# Patient Record
Sex: Male | Born: 1940 | Race: White | Hispanic: No | Marital: Single
Health system: Southern US, Community
[De-identification: ages and names within clinical notes are randomized; demographics above are authoritative.]

---

## 1998-05-10 ENCOUNTER — Ambulatory Visit (HOSPITAL_COMMUNITY): Admission: RE | Admit: 1998-05-10 | Discharge: 1998-05-10 | Payer: Self-pay | Admitting: Specialist

## 1998-06-04 ENCOUNTER — Inpatient Hospital Stay (HOSPITAL_COMMUNITY): Admission: RE | Admit: 1998-06-04 | Discharge: 1998-06-07 | Payer: Self-pay | Admitting: Specialist

## 1999-09-06 ENCOUNTER — Encounter: Payer: Self-pay | Admitting: Urology

## 1999-09-06 ENCOUNTER — Encounter (INDEPENDENT_AMBULATORY_CARE_PROVIDER_SITE_OTHER): Payer: Self-pay | Admitting: Specialist

## 1999-09-06 ENCOUNTER — Ambulatory Visit (HOSPITAL_COMMUNITY): Admission: RE | Admit: 1999-09-06 | Discharge: 1999-09-06 | Payer: Self-pay | Admitting: Urology

## 1999-09-07 ENCOUNTER — Emergency Department (HOSPITAL_COMMUNITY): Admission: EM | Admit: 1999-09-07 | Discharge: 1999-09-07 | Payer: Self-pay | Admitting: Emergency Medicine

## 1999-09-08 ENCOUNTER — Emergency Department (HOSPITAL_COMMUNITY): Admission: EM | Admit: 1999-09-08 | Discharge: 1999-09-08 | Payer: Self-pay | Admitting: Emergency Medicine

## 2000-10-21 ENCOUNTER — Ambulatory Visit (HOSPITAL_COMMUNITY): Admission: RE | Admit: 2000-10-21 | Discharge: 2000-10-21 | Payer: Self-pay | Admitting: Specialist

## 2000-10-21 ENCOUNTER — Encounter: Payer: Self-pay | Admitting: Specialist

## 2000-12-21 ENCOUNTER — Encounter: Payer: Self-pay | Admitting: Specialist

## 2000-12-28 ENCOUNTER — Inpatient Hospital Stay (HOSPITAL_COMMUNITY): Admission: RE | Admit: 2000-12-28 | Discharge: 2000-12-30 | Payer: Self-pay | Admitting: Specialist

## 2000-12-28 ENCOUNTER — Encounter: Payer: Self-pay | Admitting: Specialist

## 2002-07-01 ENCOUNTER — Encounter: Admission: RE | Admit: 2002-07-01 | Discharge: 2002-07-01 | Payer: Self-pay | Admitting: Specialist

## 2002-07-01 ENCOUNTER — Encounter: Payer: Self-pay | Admitting: Specialist

## 2003-12-04 ENCOUNTER — Ambulatory Visit (HOSPITAL_COMMUNITY): Admission: RE | Admit: 2003-12-04 | Discharge: 2003-12-04 | Payer: Self-pay | Admitting: Orthopedic Surgery

## 2006-08-27 ENCOUNTER — Ambulatory Visit (HOSPITAL_COMMUNITY): Admission: RE | Admit: 2006-08-27 | Discharge: 2006-08-27 | Payer: Self-pay | Admitting: Orthopedic Surgery

## 2009-04-18 ENCOUNTER — Ambulatory Visit: Payer: Self-pay | Admitting: Family Medicine

## 2009-04-18 DIAGNOSIS — E785 Hyperlipidemia, unspecified: Secondary | ICD-10-CM

## 2009-04-18 DIAGNOSIS — S61209A Unspecified open wound of unspecified finger without damage to nail, initial encounter: Secondary | ICD-10-CM | POA: Insufficient documentation

## 2009-04-18 DIAGNOSIS — I10 Essential (primary) hypertension: Secondary | ICD-10-CM | POA: Insufficient documentation

## 2009-04-20 ENCOUNTER — Encounter: Payer: Self-pay | Admitting: Family Medicine

## 2011-03-07 NOTE — Consult Note (Signed)
[facility]  Patient:    Spencer Lewis, Spencer Lewis                    MRN: [id number] Proc. Date: 12/30/00 Adm. Date:  [id number] Disc. Date: [id number] Attending:  Lubertha South CC:         Kerrin Champagne, M.D.   Consultation Report  I was asked to see this gentleman for urinary retention postoperative back surgery.  This gentleman has had a urologic history and has been followed by Dr. Vernie Ammons for quite some time.  He has a history of transitional cell carcinoma of the bladder.  He has a history of a neurogenic bladder and he has had voiding troubles after previous surgical operations.  He has been voiding well lately.  He also has a history of an asymptomatic renal calculus and an asymptomatic left renal cyst.  He has had some erectile dysfunction.  He could not void after this recent surgery.  A Foley catheter has been placed.  His urinalysis on December 29, 2000 showed 2150 white cells per high powered field and TNTC red cells.  There was just a few bacteria.  He has been started on Cipro and a Foley catheter has been put in place.  I do not believe a urine culture is pending at this time.  He also was put on some Flomax.  CURRENT MEDICATIONS:  Monopril, Zocor, Elavil, folic acid.  PHYSICAL EXAMINATION  ABDOMEN:  Soft and nontender.  GENITOURINARY:  External genitalia:  Unremarkable with a Foley catheter in place.  PLAN:  I believe he can go home tonight with a Foley catheter in place.  I will keep him on Flomax 0.4 mg for at least the next 10 days.  I also will continue his Cipro 0.5 g b.i.d.  He will remove the Foley catheter at home on Friday morning, January 01, 2001.  He will let Dr. Vernie Ammons know if he has any further voiding dysfunction.  He will otherwise keep his scheduled cystoscopic examination for April in the office per Dr. Vernie Ammons. DD:  12/30/00 TD:  12/30/00 Job: [id number]

## 2011-03-07 NOTE — H&P (Signed)
[facility]  Patient:    Spencer Lewis, Spencer Lewis                          MRN: [id number] Adm. Date:  12/28/00 Attending:  Kerrin Champagne, M.D. Dictator:   Alexzandrew L. Julien Girt, P.A.-C. CC:         Janene Harvey, M.D.   History and Physical  DATE OF BIRTH:  September 24, 1941.  DATE OF OFFICE VISIT HISTORY AND PHYSICAL:  December 21, 2000.  CHIEF COMPLAINT:  Upper and mid-back severe pain.  HISTORY OF PRESENT ILLNESS:  Patient is a 70 year old male, well-known to Dr. Kerrin Champagne and [facility], having long-standing history of multiple-level degenerative disk disease and also spinal stenosis. He has undergone multiple back surgeries, ranging from 1975 all the way up to his most recent surgery back in 1999.  He has had continued problems and increasing severe back pain for which he has been evaluated by Dr. Otelia Sergeant in the office.  He has undergone outpatient diagnostic studies including myelogram and post-myelogram CTs.  Myelogram workup did show moderate-to-severe central stenosis at the level of T12-L1; this is the level above his prior surgeries.  He has been treated conservatively for this pain, however, has been refractory to conservative treatment.  The pain is quite severe now.  Due to the significant findings, it is felt he could benefit from undergoing surgical intervention.  It is decided that he will undergo decompression with laminectomy at that level.  It is also noted that if this does not improve his condition, that we may have to consider multilevel fusions in the future; however, at this time, it is felt that he could benefit from undergoing decompression laminectomy.  Risks and benefits were discussed.  He has elected to proceed with surgery.  ALLERGIES:  No known drug allergies.  CURRENT MEDICATIONS 1. Monopril 40 mg daily. 2. Zocor 40 mg daily. 3. Elavil 50 mg at night. 4. Folic acid.  PAST MEDICAL HISTORY:   Hypertension, hypercholesterolemia, past history of transfusion back in 1982 with a prior back surgery, also history of bladder cancer tumor.  PAST SURGICAL HISTORY:  Excision of bladder tumor, November of 2000.  Multiple back procedures ranging from 1975 to most recently in 1999.  SOCIAL HISTORY:  Patient is married.  Two children.  Denies use of tobacco products.  Only seldom intake of alcohol.  He is retired from [employer].  FAMILY HISTORY:  Father deceased, age 50, with history of prostate disease and heart failure.  Mother deceased, age 84, with a history of congestive heart failure and Alzheimers.  REVIEW OF SYSTEMS:  GENERAL:  No fevers, chills or night sweats.  NEUROLOGIC: Patient has had some numbness and slight weakness down into the right lower extremity.  No seizures, syncope or paralysis.  RESPIRATORY:  No shortness of breath, productive cough or hemoptysis.  CARDIOVASCULAR:  Patient does have a history of hypertension.  Denies any chest pain, angina or orthopnea.  GI:  No nausea, vomiting, diarrhea or constipation.  GU:  No dysuria, hematuria or discharge.  MUSCULOSKELETAL:  Pertinent to that of the back found in the history of present illness.  PHYSICAL EXAMINATION  VITAL SIGNS:  Pulse 88, respirations 16, blood pressure 152/86.  GENERAL:  Patient is a 70 year old white male, short in stature, well-nourished, well-developed, and appears to be in no acute distress.  He is alert, oriented and cooperative.  HEENT:  Normocephalic,  atraumatic.  Pupils are round and reactive.  Oropharynx is clear.  NECK:  Supple.  No carotid bruits are appreciated.  CHEST:  Clear to auscultation and percussion.  No rhonchi or rales on exam.  HEART:  Regular rate and rhythm.  No murmur is appreciated.  ABDOMEN:  Soft abdomen, slightly round.  Bowel sounds are present.  No rebound or guarding.  Nontender to palpation.  RECTAL:  Not done, not pertinent to present  illness.  BREASTS:  Not done, not pertinent to present illness.  GENITALIA:  Not done, not pertinent to present illness.  EXTREMITIES:  Significant to that to the lower extremities.  On exam, patient does have some clonus noted in the left lower extremity with three to four beats; only two beats of clonus to the right lower extremity.  Negative straight leg raises.  Patient is able to walk heel and toe and maintain position.  Deep tendon reflexes are +1 at the knees and symmetrical, +0 at the ankles and symmetrical.  IMPRESSION 1. Severe spinal stenosis, T12-L1, with degenerative disk disease, T12-L1. 2. Hypertension. 3. Hypercholesterolemia. 4. History of transfusion in 1982 with previous back surgery. 5. Status post multiple back procedures from 1975 to 1999.  PLAN:  Patient will be admitted to [facility] to undergo a decompressive laminectomy at the level of T12 and L1.  Surgery is to be performed by Dr. Vira Browns.  Patients medical physician is Dr. Janene Harvey; Dr. Landry Mellow will be notified of the room number on admission and will be consulted if needed for any medical assistance or medical information concerning this patient. DD:  12/22/00 TD:  12/22/00 Job: [id number]

## 2011-03-07 NOTE — Op Note (Signed)
[facility]  Patient:    Spencer Lewis, Spencer Lewis                    MRN: [id number] Proc. Date: 12/28/00 Adm. Date:  [id number] Attending:  Lubertha South                           Operative Report  PREOPERATIVE DIAGNOSIS:  Central spinal canal stenosis at the junctional segment at T12-L1 above a long segment fusion L1 to sacrum.  POSTOPERATIVE DIAGNOSIS:  Central spinal canal stenosis at the junctional segment at T12-L1 above a long segement fusion L1 to sacrum.  OPERATION:  Central laminectomy and decompression at the T12-L1 SURGEON:  Kerrin Champagne, M.D.  ASSISTANT:  Javier Docker, M.D.  ANESTHESIOLOGIST:  Lestine Box, M.D.  ANESTHESIA:  GOT  ESTIMATED BLOOD LOSS:  100 cc.  DRAINS:  None.  BRIEF CLINICAL HISTORY:  The patient is a 70 year old male who has undergone previous diskectomies and fusions at L3 to sacrum and eventually extended to the L2-3 level because of spondylolisthesis and degenerative changes, then to the L1-2 level.  The patient has since developed progressive pain into his lower back and discomfort into his buttocks with ambulation and upright position.  Followup studies demonstrate complete myelogram block at the T12-L1 level above the long segment fusion consistent with spinal stenosis.  There is deformity in the sac posteriorly related to spondylosis changes of facets primarily.  He is brought to the operating room to undergo a central decompression after conservative treatment including epidural steroid and therapy has been unsuccessful in relieving pain.  INTRAOPERATIVE FINDINGS:  Severe spinal stenosis at the T12-L1 level, mild at T11-12.  There were significant hypertrophic changes involving ligamentum flavum extending down and also causing L1 compression bilaterally right side greater than left.  DESCRIPTION OF PROCEDURE:  After adequate general anesthesia, the patient in the knee chest position, Andrews  frame, standard preoperative antibiotics, standard prep with DuraPrep solution, draped in the usual manner, iodine-impregnated Vi-Drape.  The patient was given preoperative antibiotics.  Incision at the upper end of the previous incision scar, extending superiorly an additional 3 to 4 cm through the skin and subcutaneous layers using a #10 blade scalpel after infiltration with Marcaine 0.5% with 1:200,000 epinephrine.  Incision ellipsed the superior aspect of the old incision scar. It was carried down to the lumbar dorsal fascia overlying the spinous processes that were present at the lower portion of the patients dorsal spine including T11 and T12.  Clamps were first placed on the T10-T11 spinous processes, and then a clamp was placed on the T12 spinous process.  Incision carried over the spinous process of T11 and T12 bilaterally.  Two Cobbs used to elevate the paradorsal muscles off the posterior aspect of the lamina of T12 and T11.  McCullough retractor was inserted.  Leksell rongeur was then used to excise the spinous process of T12, then the posterior aspect of lamina of T12 such that a 3 mm Kerrison could be introduced in the midline beneath the lamina, and this was used to resect and perform initial central laminectomy within the very center portion of the canal.  This was carried up to the T11-12 level.  The central laminectomy was then widened using 2 and 3 mm Kerrisons.  High-speed bur was used to carefully thin the posterior aspect of the lamina as it was quite hypertrophic including thinning of the inferior articular process  of T12 bilaterally, resecting approximately 30% on either side.  With this, then, the laminectomy was carried centrally up to the T11-12 level, then the hypertrophic ligamentum flavum present was resected along with medial aspect of the facets over about 10% on either side, decompressing the T11-12 level, then continuing downwards and decompressing the  lateral recess at the T11-12 level and then T12-L1 level on the right side, resecting the medial aspect of the facet over approximately 20% of its volume and removing scar tissue and adjacent ligamentum flavum that was adherent to the thecal sac overlying the L1 nerve root as it exited beneath the L1 pedicle.  A constricting band of scar tissue and residual ligamentum flavum was resected centrally that was compressing the canal and causing severe spinal stenosis. After this resection, the spinal canal appeared to be quite nicely open.  The lateral recess on the left side was then decompressed, excising what was a very large spur off the medial aspect of the left side at T12-L1 by resecting lateral to the largest portion of the spur, decompressing the spinal canal by lifting away the osteophyte and hypertrophic ligamentum flavum at one time. It was peeled off the thecal sac quite nicely without causing undue pressure.  With this, then, the left L1 nerve root was similarly decompressed by removing hypertrophic ligamentum flavum and scar tissue that was adjacent to the T12-L1 level.  With this, then, the spinal canal was nicely decompressed and showed normal pulsation.  Irrigation was then performed.  Bone wax applied to the bleeding cancellous bone surfaces, excess bone wax removed.  Gelfoam was then placed over the central laminotomy defect, and the McCullough retractors removed.  Bleeders controlled using electrocautery.  The incision was then closed by approximating the paradorsal muscles in midline with loose #1 Vicryl sutures. The lumbodorsal fascia approximated in midline with interrupted #1 Vicryl sutures, deep subcutaneous layers approximated with interrupted #0 Vicryl sutures, more superficial layers with interrupted 2-0 Vicryl suture, and the skin closed with running subcuticular stitch of 4-0 Monocryl, and this was buried.  Tincture of Benzoin and Steri-Strips then applied.  The  patient was then returned to a supine position, reactivated, extubated, and returned to the recovery room in satisfactory condition.  All instrument and sponge counts were correct. DD:  12/28/00  TD:  12/29/00 Job: [id number]

## 2017-01-26 ENCOUNTER — Other Ambulatory Visit (HOSPITAL_COMMUNITY): Payer: Medicare HMO

## 2017-01-26 ENCOUNTER — Inpatient Hospital Stay
Admission: RE | Admit: 2017-01-26 | Discharge: 2017-02-26 | Disposition: A | Discharge status: Hospice | Payer: Medicare HMO | Attending: Internal Medicine | Admitting: Internal Medicine

## 2017-01-26 DIAGNOSIS — J984 Other disorders of lung: Secondary | ICD-10-CM

## 2017-01-26 DIAGNOSIS — A419 Sepsis, unspecified organism: Secondary | ICD-10-CM

## 2017-01-26 DIAGNOSIS — T17908A Unspecified foreign body in respiratory tract, part unspecified causing other injury, initial encounter: Secondary | ICD-10-CM

## 2017-01-26 DIAGNOSIS — J90 Pleural effusion, not elsewhere classified: Secondary | ICD-10-CM

## 2017-01-26 DIAGNOSIS — Z0189 Encounter for other specified special examinations: Secondary | ICD-10-CM

## 2017-01-26 DIAGNOSIS — R509 Fever, unspecified: Secondary | ICD-10-CM

## 2017-01-26 DIAGNOSIS — K567 Ileus, unspecified: Secondary | ICD-10-CM

## 2017-01-26 DIAGNOSIS — R1110 Vomiting, unspecified: Secondary | ICD-10-CM

## 2017-01-26 DIAGNOSIS — J869 Pyothorax without fistula: Secondary | ICD-10-CM

## 2017-01-26 DIAGNOSIS — J9690 Respiratory failure, unspecified, unspecified whether with hypoxia or hypercapnia: Secondary | ICD-10-CM

## 2017-01-26 DIAGNOSIS — Z452 Encounter for adjustment and management of vascular access device: Secondary | ICD-10-CM

## 2017-01-27 LAB — BLOOD GAS, ARTERIAL
Acid-Base Excess: 2.7 mmol/L — ABNORMAL HIGH (ref 0.0–2.0)
Bicarbonate: 27.2 mmol/L (ref 20.0–28.0)
FIO2: 40
MECHVT: 380 mL
O2 Saturation: 98.6 %
PEEP: 5 cmH2O
Patient temperature: 97.6
RATE: 14 resp/min
pCO2 arterial: 44.3 mmHg (ref 32.0–48.0)
pH, Arterial: 7.402 (ref 7.350–7.450)
pO2, Arterial: 116 mmHg — ABNORMAL HIGH (ref 83.0–108.0)

## 2017-01-27 LAB — CBC
HCT: 27.2 % — ABNORMAL LOW (ref 39.0–52.0)
Hemoglobin: 8.3 g/dL — ABNORMAL LOW (ref 13.0–17.0)
MCH: 28.2 pg (ref 26.0–34.0)
MCHC: 30.5 g/dL (ref 30.0–36.0)
MCV: 92.5 fL (ref 78.0–100.0)
Platelets: 574 10*3/uL — ABNORMAL HIGH (ref 150–400)
RBC: 2.94 MIL/uL — ABNORMAL LOW (ref 4.22–5.81)
RDW: 17.1 % — ABNORMAL HIGH (ref 11.5–15.5)
WBC: 13.6 10*3/uL — ABNORMAL HIGH (ref 4.0–10.5)

## 2017-01-27 LAB — COMPREHENSIVE METABOLIC PANEL
ALT: 13 U/L — ABNORMAL LOW (ref 17–63)
AST: 18 U/L (ref 15–41)
Albumin: 1.6 g/dL — ABNORMAL LOW (ref 3.5–5.0)
Alkaline Phosphatase: 79 U/L (ref 38–126)
Anion gap: 9 (ref 5–15)
BUN: 38 mg/dL — ABNORMAL HIGH (ref 6–20)
CO2: 24 mmol/L (ref 22–32)
Calcium: 8.1 mg/dL — ABNORMAL LOW (ref 8.9–10.3)
Chloride: 110 mmol/L (ref 101–111)
Creatinine, Ser: 0.63 mg/dL (ref 0.61–1.24)
GFR calc Af Amer: >60 mL/min (ref >60)
GFR calc non Af Amer: >60 mL/min (ref >60)
Glucose, Bld: 162 mg/dL — ABNORMAL HIGH (ref 65–99)
Potassium: 5.1 mmol/L (ref 3.5–5.1)
Sodium: 143 mmol/L (ref 135–145)
Total Bilirubin: 0.2 mg/dL — ABNORMAL LOW (ref 0.3–1.2)
Total Protein: 6.3 g/dL — ABNORMAL LOW (ref 6.5–8.1)

## 2017-01-27 LAB — C DIFFICILE QUICK SCREEN W PCR REFLEX
C Diff antigen: NEGATIVE
C Diff interpretation: NOT DETECTED
C Diff toxin: NEGATIVE

## 2017-01-27 LAB — MAGNESIUM: Magnesium: 2.1 mg/dL (ref 1.7–2.4)

## 2017-01-27 LAB — APTT: aPTT: 41 seconds — ABNORMAL HIGH (ref 24–36)

## 2017-01-28 ENCOUNTER — Other Ambulatory Visit (HOSPITAL_COMMUNITY): Payer: Medicare HMO

## 2017-01-28 LAB — CBC
HCT: 27.7 % — ABNORMAL LOW (ref 39.0–52.0)
HEMATOCRIT: 28.4 % — AB (ref 39.0–52.0)
HEMOGLOBIN: 8.6 g/dL — AB (ref 13.0–17.0)
Hemoglobin: 8.1 g/dL — ABNORMAL LOW (ref 13.0–17.0)
MCH: 28.1 pg (ref 26.0–34.0)
MCH: 27.5 pg (ref 26.0–34.0)
MCHC: 30.3 g/dL (ref 30.0–36.0)
MCHC: 29.2 g/dL — AB (ref 30.0–36.0)
MCV: 92.8 fL (ref 78.0–100.0)
MCV: 93.9 fL (ref 78.0–100.0)
PLATELETS: 542 10*3/uL — AB (ref 150–400)
Platelets: 526 10*3/uL — ABNORMAL HIGH (ref 150–400)
RBC: 3.06 MIL/uL — ABNORMAL LOW (ref 4.22–5.81)
RBC: 2.95 MIL/uL — ABNORMAL LOW (ref 4.22–5.81)
RDW: 17.3 % — ABNORMAL HIGH (ref 11.5–15.5)
RDW: 17.4 % — AB (ref 11.5–15.5)
WBC: 15.5 10*3/uL — AB (ref 4.0–10.5)
WBC: 19.4 10*3/uL — ABNORMAL HIGH (ref 4.0–10.5)

## 2017-01-28 LAB — URINALYSIS, ROUTINE W REFLEX MICROSCOPIC
Bilirubin Urine: NEGATIVE
GLUCOSE, UA: NEGATIVE mg/dL
Ketones, ur: NEGATIVE mg/dL
Nitrite: NEGATIVE
PH: 6 (ref 5.0–8.0)
Protein, ur: 100 mg/dL — AB
SPECIFIC GRAVITY, URINE: 1.016 (ref 1.005–1.030)
Squamous Epithelial / LPF: NONE SEEN

## 2017-01-28 LAB — MAGNESIUM: MAGNESIUM: 2.4 mg/dL (ref 1.7–2.4)

## 2017-01-28 LAB — BASIC METABOLIC PANEL
ANION GAP: 12 (ref 5–15)
Anion gap: 11 (ref 5–15)
BUN: 54 mg/dL — AB (ref 6–20)
BUN: 61 mg/dL — AB (ref 6–20)
CALCIUM: 8.4 mg/dL — AB (ref 8.9–10.3)
CALCIUM: 8.5 mg/dL — AB (ref 8.9–10.3)
CHLORIDE: 105 mmol/L (ref 101–111)
CO2: 26 mmol/L (ref 22–32)
CO2: 25 mmol/L (ref 22–32)
CREATININE: 0.96 mg/dL (ref 0.61–1.24)
Chloride: 110 mmol/L (ref 101–111)
Creatinine, Ser: 1.42 mg/dL — ABNORMAL HIGH (ref 0.61–1.24)
GFR calc Af Amer: 55 mL/min — ABNORMAL LOW (ref >60)
GFR, EST NON AFRICAN AMERICAN: 47 mL/min — AB (ref >60)
GLUCOSE: 127 mg/dL — AB (ref 65–99)
Glucose, Bld: 122 mg/dL — ABNORMAL HIGH (ref 65–99)
POTASSIUM: 6 mmol/L — AB (ref 3.5–5.1)
Potassium: 5.5 mmol/L — ABNORMAL HIGH (ref 3.5–5.1)
Sodium: 143 mmol/L (ref 135–145)
Sodium: 146 mmol/L — ABNORMAL HIGH (ref 135–145)

## 2017-01-28 LAB — LACTIC ACID, PLASMA: LACTIC ACID, VENOUS: 2.5 mmol/L — AB (ref 0.5–1.9)

## 2017-01-28 LAB — POTASSIUM: POTASSIUM: 5.7 mmol/L — AB (ref 3.5–5.1)

## 2017-01-29 LAB — CBC
HEMATOCRIT: 29.6 % — AB (ref 39.0–52.0)
Hemoglobin: 8.9 g/dL — ABNORMAL LOW (ref 13.0–17.0)
MCH: 28.2 pg (ref 26.0–34.0)
MCHC: 30.1 g/dL (ref 30.0–36.0)
MCV: 93.7 fL (ref 78.0–100.0)
PLATELETS: 500 10*3/uL — AB (ref 150–400)
RBC: 3.16 MIL/uL — AB (ref 4.22–5.81)
RDW: 18 % — AB (ref 11.5–15.5)
WBC: 20.2 10*3/uL — AB (ref 4.0–10.5)

## 2017-01-29 LAB — BASIC METABOLIC PANEL
Anion gap: 13 (ref 5–15)
BUN: 70 mg/dL — AB (ref 6–20)
CO2: 23 mmol/L (ref 22–32)
CREATININE: 1.63 mg/dL — AB (ref 0.61–1.24)
Calcium: 7.8 mg/dL — ABNORMAL LOW (ref 8.9–10.3)
Chloride: 116 mmol/L — ABNORMAL HIGH (ref 101–111)
GFR calc Af Amer: 47 mL/min — ABNORMAL LOW (ref >60)
GFR, EST NON AFRICAN AMERICAN: 40 mL/min — AB (ref >60)
GLUCOSE: 111 mg/dL — AB (ref 65–99)
POTASSIUM: 3.9 mmol/L (ref 3.5–5.1)
Sodium: 152 mmol/L — ABNORMAL HIGH (ref 135–145)

## 2017-01-29 LAB — PHOSPHORUS: Phosphorus: 6.3 mg/dL — ABNORMAL HIGH (ref 2.5–4.6)

## 2017-01-29 LAB — MAGNESIUM: MAGNESIUM: 2.4 mg/dL (ref 1.7–2.4)

## 2017-01-29 LAB — PROCALCITONIN: Procalcitonin: 1.42 ng/mL

## 2017-01-30 ENCOUNTER — Other Ambulatory Visit (HOSPITAL_COMMUNITY): Payer: Medicare HMO

## 2017-01-30 LAB — CBC
HEMATOCRIT: 26.4 % — AB (ref 39.0–52.0)
HEMOGLOBIN: 7.8 g/dL — AB (ref 13.0–17.0)
MCH: 27.7 pg (ref 26.0–34.0)
MCHC: 29.5 g/dL — ABNORMAL LOW (ref 30.0–36.0)
MCV: 93.6 fL (ref 78.0–100.0)
Platelets: 485 10*3/uL — ABNORMAL HIGH (ref 150–400)
RBC: 2.82 MIL/uL — AB (ref 4.22–5.81)
RDW: 17.4 % — ABNORMAL HIGH (ref 11.5–15.5)
WBC: 18.4 10*3/uL — ABNORMAL HIGH (ref 4.0–10.5)

## 2017-01-30 LAB — URINE CULTURE: Culture: >=100000 — AB

## 2017-01-30 LAB — BASIC METABOLIC PANEL
ANION GAP: 12 (ref 5–15)
ANION GAP: 9 (ref 5–15)
BUN: 69 mg/dL — AB (ref 6–20)
BUN: 64 mg/dL — AB (ref 6–20)
CHLORIDE: 117 mmol/L — AB (ref 101–111)
CO2: 23 mmol/L (ref 22–32)
CO2: 24 mmol/L (ref 22–32)
CREATININE: 1.51 mg/dL — AB (ref 0.61–1.24)
Calcium: 7.6 mg/dL — ABNORMAL LOW (ref 8.9–10.3)
Calcium: 7.7 mg/dL — ABNORMAL LOW (ref 8.9–10.3)
Chloride: 117 mmol/L — ABNORMAL HIGH (ref 101–111)
Creatinine, Ser: 1.49 mg/dL — ABNORMAL HIGH (ref 0.61–1.24)
GFR calc Af Amer: 51 mL/min — ABNORMAL LOW (ref >60)
GFR calc non Af Amer: 44 mL/min — ABNORMAL LOW (ref >60)
GFR, EST AFRICAN AMERICAN: 52 mL/min — AB (ref >60)
GFR, EST NON AFRICAN AMERICAN: 45 mL/min — AB (ref >60)
Glucose, Bld: 128 mg/dL — ABNORMAL HIGH (ref 65–99)
Glucose, Bld: 119 mg/dL — ABNORMAL HIGH (ref 65–99)
POTASSIUM: 2.8 mmol/L — AB (ref 3.5–5.1)
POTASSIUM: 3.6 mmol/L (ref 3.5–5.1)
SODIUM: 150 mmol/L — AB (ref 135–145)
Sodium: 152 mmol/L — ABNORMAL HIGH (ref 135–145)

## 2017-01-30 LAB — CULTURE, RESPIRATORY W GRAM STAIN

## 2017-01-30 LAB — MAGNESIUM: Magnesium: 2.1 mg/dL (ref 1.7–2.4)

## 2017-01-30 LAB — PHOSPHORUS: PHOSPHORUS: 6.1 mg/dL — AB (ref 2.5–4.6)

## 2017-01-30 LAB — CULTURE, RESPIRATORY

## 2017-01-31 LAB — CBC
HCT: 24.8 % — ABNORMAL LOW (ref 39.0–52.0)
Hemoglobin: 7.4 g/dL — ABNORMAL LOW (ref 13.0–17.0)
MCH: 27.7 pg (ref 26.0–34.0)
MCHC: 29.8 g/dL — AB (ref 30.0–36.0)
MCV: 92.9 fL (ref 78.0–100.0)
Platelets: 418 10*3/uL — ABNORMAL HIGH (ref 150–400)
RBC: 2.67 MIL/uL — AB (ref 4.22–5.81)
RDW: 17.5 % — AB (ref 11.5–15.5)
WBC: 16.8 10*3/uL — ABNORMAL HIGH (ref 4.0–10.5)

## 2017-01-31 LAB — BASIC METABOLIC PANEL
Anion gap: 9 (ref 5–15)
BUN: 62 mg/dL — AB (ref 6–20)
CALCIUM: 7.8 mg/dL — AB (ref 8.9–10.3)
CO2: 23 mmol/L (ref 22–32)
CREATININE: 1.47 mg/dL — AB (ref 0.61–1.24)
Chloride: 117 mmol/L — ABNORMAL HIGH (ref 101–111)
GFR calc non Af Amer: 46 mL/min — ABNORMAL LOW (ref >60)
GFR, EST AFRICAN AMERICAN: 53 mL/min — AB (ref >60)
GLUCOSE: 120 mg/dL — AB (ref 65–99)
Potassium: 3.4 mmol/L — ABNORMAL LOW (ref 3.5–5.1)
Sodium: 149 mmol/L — ABNORMAL HIGH (ref 135–145)

## 2017-01-31 LAB — MAGNESIUM: MAGNESIUM: 2.2 mg/dL (ref 1.7–2.4)

## 2017-01-31 LAB — PHOSPHORUS: Phosphorus: 4.7 mg/dL — ABNORMAL HIGH (ref 2.5–4.6)

## 2017-02-01 ENCOUNTER — Institutional Professional Consult (permissible substitution) (HOSPITAL_COMMUNITY): Payer: Medicare HMO

## 2017-02-01 LAB — BASIC METABOLIC PANEL
ANION GAP: 9 (ref 5–15)
BUN: 70 mg/dL — ABNORMAL HIGH (ref 6–20)
CHLORIDE: 119 mmol/L — AB (ref 101–111)
CO2: 22 mmol/L (ref 22–32)
Calcium: 7.9 mg/dL — ABNORMAL LOW (ref 8.9–10.3)
Creatinine, Ser: 1.33 mg/dL — ABNORMAL HIGH (ref 0.61–1.24)
GFR calc Af Amer: 60 mL/min — ABNORMAL LOW (ref >60)
GFR, EST NON AFRICAN AMERICAN: 51 mL/min — AB (ref >60)
Glucose, Bld: 111 mg/dL — ABNORMAL HIGH (ref 65–99)
POTASSIUM: 3.9 mmol/L (ref 3.5–5.1)
Sodium: 150 mmol/L — ABNORMAL HIGH (ref 135–145)

## 2017-02-01 LAB — VANCOMYCIN, TROUGH
VANCOMYCIN TR: 40 ug/mL — AB (ref 15–20)
Vancomycin Tr: 37 ug/mL (ref 15–20)

## 2017-02-02 ENCOUNTER — Other Ambulatory Visit (HOSPITAL_COMMUNITY): Payer: Medicare HMO

## 2017-02-02 LAB — CULTURE, BLOOD (ROUTINE X 2)
Culture: NO GROWTH
Culture: NO GROWTH
SPECIAL REQUESTS: ADEQUATE
Special Requests: ADEQUATE

## 2017-02-02 LAB — BASIC METABOLIC PANEL
Anion gap: 11 (ref 5–15)
BUN: 62 mg/dL — AB (ref 6–20)
CO2: 22 mmol/L (ref 22–32)
Calcium: 8.8 mg/dL — ABNORMAL LOW (ref 8.9–10.3)
Chloride: 119 mmol/L — ABNORMAL HIGH (ref 101–111)
Creatinine, Ser: 1.39 mg/dL — ABNORMAL HIGH (ref 0.61–1.24)
GFR calc Af Amer: 56 mL/min — ABNORMAL LOW (ref >60)
GFR, EST NON AFRICAN AMERICAN: 49 mL/min — AB (ref >60)
GLUCOSE: 117 mg/dL — AB (ref 65–99)
Potassium: 4.1 mmol/L (ref 3.5–5.1)
SODIUM: 152 mmol/L — AB (ref 135–145)

## 2017-02-02 LAB — BODY FLUID CELL COUNT WITH DIFFERENTIAL
Eos, Fluid: 1 %
LYMPHS FL: 34 %
MONOCYTE-MACROPHAGE-SEROUS FLUID: 18 % — AB (ref 50–90)
NEUTROPHIL FLUID: 47 % — AB (ref 0–25)
WBC FLUID: 218 uL (ref 0–1000)

## 2017-02-02 LAB — GRAM STAIN

## 2017-02-02 LAB — LIPID PANEL
CHOL/HDL RATIO: 4.4 ratio
CHOLESTEROL: 79 mg/dL (ref 0–200)
HDL: 18 mg/dL — AB (ref >40)
LDL Cholesterol: 19 mg/dL (ref 0–99)
TRIGLYCERIDES: 209 mg/dL — AB (ref <150)
VLDL: 42 mg/dL — ABNORMAL HIGH (ref 0–40)

## 2017-02-02 LAB — PROTEIN, PLEURAL OR PERITONEAL FLUID: Total protein, fluid: 3.7 g/dL

## 2017-02-02 LAB — GLUCOSE, PLEURAL OR PERITONEAL FLUID: GLUCOSE FL: 119 mg/dL

## 2017-02-02 LAB — VANCOMYCIN, TROUGH: VANCOMYCIN TR: 30 ug/mL — AB (ref 15–20)

## 2017-02-02 LAB — LACTATE DEHYDROGENASE, PLEURAL OR PERITONEAL FLUID: LD, Fluid: 186 U/L — ABNORMAL HIGH (ref 3–23)

## 2017-02-02 LAB — AMYLASE: Amylase: 178 U/L — ABNORMAL HIGH (ref 28–100)

## 2017-02-02 MED ORDER — LIDOCAINE HCL 1 % IJ SOLN
INTRAMUSCULAR | Status: AC
  Filled 2017-02-02: qty 20

## 2017-02-02 NOTE — Procedures (Signed)
PROCEDURE SUMMARY:  Successful US guided left thoracentesis. Yielded 420 mL of clear yellow fluid. Pt tolerated procedure well. No immediate complications.  Specimen was sent for labs. CXR ordered.  Maddock Finigan S Ned Kakar PA-C 02/02/2017 1:55 PM

## 2017-02-03 ENCOUNTER — Other Ambulatory Visit (HOSPITAL_COMMUNITY): Payer: Medicare HMO

## 2017-02-03 LAB — RENAL FUNCTION PANEL
ALBUMIN: 1.4 g/dL — AB (ref 3.5–5.0)
Anion gap: 10 (ref 5–15)
BUN: 62 mg/dL — AB (ref 6–20)
CO2: 19 mmol/L — ABNORMAL LOW (ref 22–32)
Calcium: 8.4 mg/dL — ABNORMAL LOW (ref 8.9–10.3)
Chloride: 121 mmol/L — ABNORMAL HIGH (ref 101–111)
Creatinine, Ser: 1.34 mg/dL — ABNORMAL HIGH (ref 0.61–1.24)
GFR calc Af Amer: 59 mL/min — ABNORMAL LOW (ref >60)
GFR calc non Af Amer: 51 mL/min — ABNORMAL LOW (ref >60)
GLUCOSE: 138 mg/dL — AB (ref 65–99)
PHOSPHORUS: 5.2 mg/dL — AB (ref 2.5–4.6)
POTASSIUM: 4.1 mmol/L (ref 3.5–5.1)
Sodium: 150 mmol/L — ABNORMAL HIGH (ref 135–145)

## 2017-02-03 LAB — CBC WITH DIFFERENTIAL/PLATELET
BASOS PCT: 0 %
Basophils Absolute: 0 10*3/uL (ref 0.0–0.1)
EOS ABS: 0.4 10*3/uL (ref 0.0–0.7)
Eosinophils Relative: 2 %
HCT: 24.2 % — ABNORMAL LOW (ref 39.0–52.0)
HEMOGLOBIN: 7.3 g/dL — AB (ref 13.0–17.0)
Lymphocytes Relative: 7 %
Lymphs Abs: 1.4 10*3/uL (ref 0.7–4.0)
MCH: 28.1 pg (ref 26.0–34.0)
MCHC: 30.2 g/dL (ref 30.0–36.0)
MCV: 93.1 fL (ref 78.0–100.0)
MONO ABS: 1.4 10*3/uL — AB (ref 0.1–1.0)
Monocytes Relative: 7 %
NEUTROS ABS: 17 10*3/uL — AB (ref 1.7–7.7)
Neutrophils Relative %: 84 %
Platelets: 405 10*3/uL — ABNORMAL HIGH (ref 150–400)
RBC: 2.6 MIL/uL — ABNORMAL LOW (ref 4.22–5.81)
RDW: 18.2 % — ABNORMAL HIGH (ref 11.5–15.5)
WBC: 20.2 10*3/uL — ABNORMAL HIGH (ref 4.0–10.5)

## 2017-02-03 LAB — C DIFFICILE QUICK SCREEN W PCR REFLEX
C DIFFICLE (CDIFF) ANTIGEN: NEGATIVE
C Diff interpretation: NOT DETECTED
C Diff toxin: NEGATIVE

## 2017-02-03 LAB — MAGNESIUM: MAGNESIUM: 2.2 mg/dL (ref 1.7–2.4)

## 2017-02-03 LAB — VANCOMYCIN, TROUGH: Vancomycin Tr: 23 ug/mL (ref 15–20)

## 2017-02-04 LAB — RENAL FUNCTION PANEL
Albumin: 1.4 g/dL — ABNORMAL LOW (ref 3.5–5.0)
Anion gap: 7 (ref 5–15)
BUN: 56 mg/dL — AB (ref 6–20)
CHLORIDE: 117 mmol/L — AB (ref 101–111)
CO2: 19 mmol/L — AB (ref 22–32)
Calcium: 7.8 mg/dL — ABNORMAL LOW (ref 8.9–10.3)
Creatinine, Ser: 1.12 mg/dL (ref 0.61–1.24)
GFR calc Af Amer: >60 mL/min (ref >60)
GFR calc non Af Amer: >60 mL/min (ref >60)
GLUCOSE: 153 mg/dL — AB (ref 65–99)
POTASSIUM: 3.9 mmol/L (ref 3.5–5.1)
Phosphorus: 3.9 mg/dL (ref 2.5–4.6)
Sodium: 143 mmol/L (ref 135–145)

## 2017-02-04 LAB — MAGNESIUM: Magnesium: 2 mg/dL (ref 1.7–2.4)

## 2017-02-04 LAB — CBC WITH DIFFERENTIAL/PLATELET
BAND NEUTROPHILS: 5 %
BASOS ABS: 0 10*3/uL (ref 0.0–0.1)
BASOS PCT: 0 %
Eosinophils Absolute: 0.4 10*3/uL (ref 0.0–0.7)
Eosinophils Relative: 2 %
HCT: 23.6 % — ABNORMAL LOW (ref 39.0–52.0)
Hemoglobin: 7.2 g/dL — ABNORMAL LOW (ref 13.0–17.0)
LYMPHS ABS: 0.6 10*3/uL — AB (ref 0.7–4.0)
Lymphocytes Relative: 3 %
MCH: 28.5 pg (ref 26.0–34.0)
MCHC: 30.5 g/dL (ref 30.0–36.0)
MCV: 93.3 fL (ref 78.0–100.0)
MONO ABS: 0.8 10*3/uL (ref 0.1–1.0)
Monocytes Relative: 4 %
NEUTROS ABS: 17.6 10*3/uL — AB (ref 1.7–7.7)
NEUTROS PCT: 86 %
PLATELETS: 352 10*3/uL (ref 150–400)
RBC: 2.53 MIL/uL — ABNORMAL LOW (ref 4.22–5.81)
RDW: 18 % — AB (ref 11.5–15.5)
WBC: 19.4 10*3/uL — ABNORMAL HIGH (ref 4.0–10.5)

## 2017-02-05 ENCOUNTER — Other Ambulatory Visit (HOSPITAL_COMMUNITY): Payer: Medicare HMO

## 2017-02-06 LAB — CBC WITH DIFFERENTIAL/PLATELET
BASOS ABS: 0 10*3/uL (ref 0.0–0.1)
Basophils Relative: 0 %
Eosinophils Absolute: 0.4 10*3/uL (ref 0.0–0.7)
Eosinophils Relative: 3 %
HEMATOCRIT: 21.1 % — AB (ref 39.0–52.0)
Hemoglobin: 6.3 g/dL — CL (ref 13.0–17.0)
LYMPHS ABS: 1.3 10*3/uL (ref 0.7–4.0)
Lymphocytes Relative: 9 %
MCH: 27.3 pg (ref 26.0–34.0)
MCHC: 29.9 g/dL — AB (ref 30.0–36.0)
MCV: 91.3 fL (ref 78.0–100.0)
MONOS PCT: 5 %
Monocytes Absolute: 0.7 10*3/uL (ref 0.1–1.0)
NEUTROS ABS: 12.1 10*3/uL — AB (ref 1.7–7.7)
NEUTROS PCT: 83 %
Platelets: 308 10*3/uL (ref 150–400)
RBC: 2.31 MIL/uL — ABNORMAL LOW (ref 4.22–5.81)
RDW: 18.3 % — ABNORMAL HIGH (ref 11.5–15.5)
WBC: 14.5 10*3/uL — AB (ref 4.0–10.5)

## 2017-02-06 LAB — ABO/RH: ABO/RH(D): A POS

## 2017-02-06 LAB — RENAL FUNCTION PANEL
ANION GAP: 8 (ref 5–15)
Albumin: 1.1 g/dL — ABNORMAL LOW (ref 3.5–5.0)
BUN: 70 mg/dL — ABNORMAL HIGH (ref 6–20)
CHLORIDE: 117 mmol/L — AB (ref 101–111)
CO2: 19 mmol/L — AB (ref 22–32)
Calcium: 8 mg/dL — ABNORMAL LOW (ref 8.9–10.3)
Creatinine, Ser: 1.2 mg/dL (ref 0.61–1.24)
GFR calc Af Amer: >60 mL/min (ref >60)
GFR calc non Af Amer: 58 mL/min — ABNORMAL LOW (ref >60)
Glucose, Bld: 159 mg/dL — ABNORMAL HIGH (ref 65–99)
POTASSIUM: 3.8 mmol/L (ref 3.5–5.1)
Phosphorus: 4.8 mg/dL — ABNORMAL HIGH (ref 2.5–4.6)
SODIUM: 144 mmol/L (ref 135–145)

## 2017-02-06 LAB — PREPARE RBC (CROSSMATCH)

## 2017-02-06 LAB — MAGNESIUM: Magnesium: 2 mg/dL (ref 1.7–2.4)

## 2017-02-07 LAB — CBC
HCT: 31.1 % — ABNORMAL LOW (ref 39.0–52.0)
HEMOGLOBIN: 10 g/dL — AB (ref 13.0–17.0)
MCH: 28.7 pg (ref 26.0–34.0)
MCHC: 32.2 g/dL (ref 30.0–36.0)
MCV: 89.4 fL (ref 78.0–100.0)
PLATELETS: 286 10*3/uL (ref 150–400)
RBC: 3.48 MIL/uL — AB (ref 4.22–5.81)
RDW: 17 % — ABNORMAL HIGH (ref 11.5–15.5)
WBC: 17 10*3/uL — AB (ref 4.0–10.5)

## 2017-02-07 LAB — BPAM RBC
BLOOD PRODUCT EXPIRATION DATE: 201804282359
Blood Product Expiration Date: 201804282359
ISSUE DATE / TIME: 201804201331
ISSUE DATE / TIME: 201804201628
UNIT TYPE AND RH: 600
Unit Type and Rh: 600

## 2017-02-07 LAB — TYPE AND SCREEN
ABO/RH(D): A POS
Antibody Screen: NEGATIVE
UNIT DIVISION: 0
Unit division: 0

## 2017-02-07 LAB — RENAL FUNCTION PANEL
ANION GAP: 8 (ref 5–15)
Albumin: 1.3 g/dL — ABNORMAL LOW (ref 3.5–5.0)
BUN: 69 mg/dL — ABNORMAL HIGH (ref 6–20)
CALCIUM: 8.3 mg/dL — AB (ref 8.9–10.3)
CHLORIDE: 118 mmol/L — AB (ref 101–111)
CO2: 19 mmol/L — AB (ref 22–32)
CREATININE: 1.11 mg/dL (ref 0.61–1.24)
GFR calc non Af Amer: >60 mL/min (ref >60)
Glucose, Bld: 155 mg/dL — ABNORMAL HIGH (ref 65–99)
PHOSPHORUS: 4.4 mg/dL (ref 2.5–4.6)
Potassium: 4 mmol/L (ref 3.5–5.1)
Sodium: 145 mmol/L (ref 135–145)

## 2017-02-07 LAB — CULTURE, BODY FLUID-BOTTLE: CULTURE: NO GROWTH

## 2017-02-07 LAB — MAGNESIUM: MAGNESIUM: 2 mg/dL (ref 1.7–2.4)

## 2017-02-07 LAB — CULTURE, BODY FLUID W GRAM STAIN -BOTTLE

## 2017-02-08 LAB — CULTURE, BLOOD (ROUTINE X 2)
CULTURE: NO GROWTH
CULTURE: NO GROWTH
SPECIAL REQUESTS: ADEQUATE
Special Requests: ADEQUATE

## 2017-02-09 ENCOUNTER — Other Ambulatory Visit (HOSPITAL_COMMUNITY): Payer: Medicare HMO

## 2017-02-09 LAB — RENAL FUNCTION PANEL
Albumin: 1.6 g/dL — ABNORMAL LOW (ref 3.5–5.0)
Anion gap: 7 (ref 5–15)
BUN: 65 mg/dL — AB (ref 6–20)
CALCIUM: 8.3 mg/dL — AB (ref 8.9–10.3)
CO2: 21 mmol/L — AB (ref 22–32)
CREATININE: 1.08 mg/dL (ref 0.61–1.24)
Chloride: 116 mmol/L — ABNORMAL HIGH (ref 101–111)
GFR calc Af Amer: >60 mL/min (ref >60)
GFR calc non Af Amer: >60 mL/min (ref >60)
GLUCOSE: 179 mg/dL — AB (ref 65–99)
Phosphorus: 3.9 mg/dL (ref 2.5–4.6)
Potassium: 4 mmol/L (ref 3.5–5.1)
SODIUM: 144 mmol/L (ref 135–145)

## 2017-02-09 LAB — CBC WITH DIFFERENTIAL/PLATELET
BASOS ABS: 0 10*3/uL (ref 0.0–0.1)
Basophils Relative: 0 %
Eosinophils Absolute: 0.2 10*3/uL (ref 0.0–0.7)
Eosinophils Relative: 1 %
HCT: 31.6 % — ABNORMAL LOW (ref 39.0–52.0)
Hemoglobin: 10.1 g/dL — ABNORMAL LOW (ref 13.0–17.0)
Lymphocytes Relative: 7 %
Lymphs Abs: 1.4 10*3/uL (ref 0.7–4.0)
MCH: 28.9 pg (ref 26.0–34.0)
MCHC: 32 g/dL (ref 30.0–36.0)
MCV: 90.5 fL (ref 78.0–100.0)
MONO ABS: 1.2 10*3/uL — AB (ref 0.1–1.0)
MONOS PCT: 6 %
Neutro Abs: 16.7 10*3/uL — ABNORMAL HIGH (ref 1.7–7.7)
Neutrophils Relative %: 86 %
PLATELETS: 269 10*3/uL (ref 150–400)
RBC: 3.49 MIL/uL — AB (ref 4.22–5.81)
RDW: 18.3 % — ABNORMAL HIGH (ref 11.5–15.5)
Smear Review: ADEQUATE
WBC: 19.5 10*3/uL — ABNORMAL HIGH (ref 4.0–10.5)

## 2017-02-09 LAB — MAGNESIUM: Magnesium: 2.2 mg/dL (ref 1.7–2.4)

## 2017-02-12 LAB — CBC WITH DIFFERENTIAL/PLATELET
BASOS PCT: 1 %
Basophils Absolute: 0.2 10*3/uL — ABNORMAL HIGH (ref 0.0–0.1)
EOS PCT: 2 %
Eosinophils Absolute: 0.3 10*3/uL (ref 0.0–0.7)
HEMATOCRIT: 31 % — AB (ref 39.0–52.0)
HEMOGLOBIN: 9.4 g/dL — AB (ref 13.0–17.0)
LYMPHS ABS: 2 10*3/uL (ref 0.7–4.0)
Lymphocytes Relative: 12 %
MCH: 28.3 pg (ref 26.0–34.0)
MCHC: 30.3 g/dL (ref 30.0–36.0)
MCV: 93.4 fL (ref 78.0–100.0)
MONOS PCT: 8 %
Monocytes Absolute: 1.3 10*3/uL — ABNORMAL HIGH (ref 0.1–1.0)
Neutro Abs: 12.6 10*3/uL — ABNORMAL HIGH (ref 1.7–7.7)
Neutrophils Relative %: 77 %
Platelets: 242 10*3/uL (ref 150–400)
RBC: 3.32 MIL/uL — AB (ref 4.22–5.81)
RDW: 19.5 % — ABNORMAL HIGH (ref 11.5–15.5)
WBC: 16.4 10*3/uL — ABNORMAL HIGH (ref 4.0–10.5)

## 2017-02-12 LAB — BASIC METABOLIC PANEL
Anion gap: 7 (ref 5–15)
BUN: 88 mg/dL — ABNORMAL HIGH (ref 6–20)
CO2: 23 mmol/L (ref 22–32)
CREATININE: 1.31 mg/dL — AB (ref 0.61–1.24)
Calcium: 8.3 mg/dL — ABNORMAL LOW (ref 8.9–10.3)
Chloride: 118 mmol/L — ABNORMAL HIGH (ref 101–111)
GFR calc non Af Amer: 52 mL/min — ABNORMAL LOW (ref >60)
GLUCOSE: 139 mg/dL — AB (ref 65–99)
Potassium: 4.4 mmol/L (ref 3.5–5.1)
Sodium: 148 mmol/L — ABNORMAL HIGH (ref 135–145)

## 2017-02-12 LAB — LACTIC ACID, PLASMA: Lactic Acid, Venous: 1.1 mmol/L (ref 0.5–1.9)

## 2017-02-13 LAB — CBC WITH DIFFERENTIAL/PLATELET
BASOS PCT: 0 %
Basophils Absolute: 0 10*3/uL (ref 0.0–0.1)
EOS ABS: 0.3 10*3/uL (ref 0.0–0.7)
EOS PCT: 2 %
HEMATOCRIT: 29.7 % — AB (ref 39.0–52.0)
Hemoglobin: 9 g/dL — ABNORMAL LOW (ref 13.0–17.0)
Lymphocytes Relative: 15 %
Lymphs Abs: 2.1 10*3/uL (ref 0.7–4.0)
MCH: 28.5 pg (ref 26.0–34.0)
MCHC: 30.3 g/dL (ref 30.0–36.0)
MCV: 94 fL (ref 78.0–100.0)
MONO ABS: 1 10*3/uL (ref 0.1–1.0)
Monocytes Relative: 7 %
NEUTROS ABS: 10.7 10*3/uL — AB (ref 1.7–7.7)
Neutrophils Relative %: 76 %
PLATELETS: 221 10*3/uL (ref 150–400)
RBC: 3.16 MIL/uL — ABNORMAL LOW (ref 4.22–5.81)
RDW: 19.7 % — AB (ref 11.5–15.5)
WBC: 14.1 10*3/uL — ABNORMAL HIGH (ref 4.0–10.5)

## 2017-02-16 ENCOUNTER — Other Ambulatory Visit (HOSPITAL_COMMUNITY): Payer: Medicare HMO

## 2017-02-17 ENCOUNTER — Other Ambulatory Visit (HOSPITAL_COMMUNITY): Payer: Medicare HMO

## 2017-02-17 LAB — CBC WITH DIFFERENTIAL/PLATELET
BASOS ABS: 0.1 10*3/uL (ref 0.0–0.1)
Basophils Relative: 1 %
Eosinophils Absolute: 0.3 10*3/uL (ref 0.0–0.7)
Eosinophils Relative: 2 %
HCT: 29.9 % — ABNORMAL LOW (ref 39.0–52.0)
HEMOGLOBIN: 8.8 g/dL — AB (ref 13.0–17.0)
LYMPHS PCT: 13 %
Lymphs Abs: 1.8 10*3/uL (ref 0.7–4.0)
MCH: 28.8 pg (ref 26.0–34.0)
MCHC: 29.4 g/dL — ABNORMAL LOW (ref 30.0–36.0)
MCV: 97.7 fL (ref 78.0–100.0)
MONO ABS: 0.9 10*3/uL (ref 0.1–1.0)
MONOS PCT: 7 %
NEUTROS ABS: 10.2 10*3/uL — AB (ref 1.7–7.7)
Neutrophils Relative %: 77 %
Platelets: 301 10*3/uL (ref 150–400)
RBC: 3.06 MIL/uL — ABNORMAL LOW (ref 4.22–5.81)
RDW: 19.8 % — AB (ref 11.5–15.5)
WBC: 13.2 10*3/uL — ABNORMAL HIGH (ref 4.0–10.5)

## 2017-02-17 LAB — RENAL FUNCTION PANEL
Albumin: 1.5 g/dL — ABNORMAL LOW (ref 3.5–5.0)
Anion gap: 9 (ref 5–15)
BUN: 96 mg/dL — AB (ref 6–20)
CHLORIDE: 121 mmol/L — AB (ref 101–111)
CO2: 23 mmol/L (ref 22–32)
Calcium: 8.8 mg/dL — ABNORMAL LOW (ref 8.9–10.3)
Creatinine, Ser: 1.38 mg/dL — ABNORMAL HIGH (ref 0.61–1.24)
GFR calc Af Amer: 57 mL/min — ABNORMAL LOW (ref >60)
GFR, EST NON AFRICAN AMERICAN: 49 mL/min — AB (ref >60)
GLUCOSE: 143 mg/dL — AB (ref 65–99)
POTASSIUM: 4.8 mmol/L (ref 3.5–5.1)
Phosphorus: 5 mg/dL — ABNORMAL HIGH (ref 2.5–4.6)
Sodium: 153 mmol/L — ABNORMAL HIGH (ref 135–145)

## 2017-02-17 LAB — MAGNESIUM: Magnesium: 2.6 mg/dL — ABNORMAL HIGH (ref 1.7–2.4)

## 2017-02-18 LAB — MAGNESIUM: MAGNESIUM: 2.4 mg/dL (ref 1.7–2.4)

## 2017-02-18 LAB — BASIC METABOLIC PANEL
ANION GAP: 9 (ref 5–15)
BUN: 96 mg/dL — AB (ref 6–20)
CO2: 24 mmol/L (ref 22–32)
Calcium: 8.5 mg/dL — ABNORMAL LOW (ref 8.9–10.3)
Chloride: 112 mmol/L — ABNORMAL HIGH (ref 101–111)
Creatinine, Ser: 1.44 mg/dL — ABNORMAL HIGH (ref 0.61–1.24)
GFR, EST AFRICAN AMERICAN: 54 mL/min — AB (ref >60)
GFR, EST NON AFRICAN AMERICAN: 47 mL/min — AB (ref >60)
Glucose, Bld: 152 mg/dL — ABNORMAL HIGH (ref 65–99)
POTASSIUM: 4.9 mmol/L (ref 3.5–5.1)
SODIUM: 145 mmol/L (ref 135–145)

## 2017-02-18 LAB — PHOSPHORUS: PHOSPHORUS: 4.4 mg/dL (ref 2.5–4.6)

## 2017-02-20 LAB — RENAL FUNCTION PANEL
Albumin: 1.3 g/dL — ABNORMAL LOW (ref 3.5–5.0)
Anion gap: 11 (ref 5–15)
BUN: 91 mg/dL — ABNORMAL HIGH (ref 6–20)
CALCIUM: 7.7 mg/dL — AB (ref 8.9–10.3)
CO2: 23 mmol/L (ref 22–32)
Chloride: 97 mmol/L — ABNORMAL LOW (ref 101–111)
Creatinine, Ser: 1.29 mg/dL — ABNORMAL HIGH (ref 0.61–1.24)
GFR, EST NON AFRICAN AMERICAN: 53 mL/min — AB (ref >60)
Glucose, Bld: 145 mg/dL — ABNORMAL HIGH (ref 65–99)
PHOSPHORUS: 5.1 mg/dL — AB (ref 2.5–4.6)
Potassium: 4.5 mmol/L (ref 3.5–5.1)
SODIUM: 131 mmol/L — AB (ref 135–145)

## 2017-02-20 LAB — CBC WITH DIFFERENTIAL/PLATELET
BASOS PCT: 0 %
Basophils Absolute: 0 10*3/uL (ref 0.0–0.1)
EOS ABS: 0.4 10*3/uL (ref 0.0–0.7)
Eosinophils Relative: 4 %
HCT: 23.1 % — ABNORMAL LOW (ref 39.0–52.0)
Hemoglobin: 7.1 g/dL — ABNORMAL LOW (ref 13.0–17.0)
Lymphocytes Relative: 14 %
Lymphs Abs: 1.3 10*3/uL (ref 0.7–4.0)
MCH: 28.5 pg (ref 26.0–34.0)
MCHC: 30.7 g/dL (ref 30.0–36.0)
MCV: 92.8 fL (ref 78.0–100.0)
Monocytes Absolute: 1 10*3/uL (ref 0.1–1.0)
Monocytes Relative: 10 %
NEUTROS PCT: 72 %
Neutro Abs: 7.1 10*3/uL (ref 1.7–7.7)
Platelets: 434 10*3/uL — ABNORMAL HIGH (ref 150–400)
RBC: 2.49 MIL/uL — AB (ref 4.22–5.81)
RDW: 18 % — ABNORMAL HIGH (ref 11.5–15.5)
WBC: 9.7 10*3/uL (ref 4.0–10.5)

## 2017-02-20 LAB — MAGNESIUM: MAGNESIUM: 2.2 mg/dL (ref 1.7–2.4)

## 2017-02-21 ENCOUNTER — Other Ambulatory Visit (HOSPITAL_COMMUNITY): Payer: Medicare HMO

## 2017-02-21 LAB — URINALYSIS, ROUTINE W REFLEX MICROSCOPIC
Bilirubin Urine: NEGATIVE
Glucose, UA: NEGATIVE mg/dL
Hgb urine dipstick: NEGATIVE
KETONES UR: NEGATIVE mg/dL
Nitrite: NEGATIVE
PH: 6 (ref 5.0–8.0)
PROTEIN: 30 mg/dL — AB
SQUAMOUS EPITHELIAL / LPF: NONE SEEN
Specific Gravity, Urine: 1.006 (ref 1.005–1.030)

## 2017-02-22 LAB — URINE CULTURE: CULTURE: NO GROWTH

## 2017-02-26 LAB — CULTURE, BLOOD (ROUTINE X 2)
CULTURE: NO GROWTH
Culture: NO GROWTH
SPECIAL REQUESTS: ADEQUATE
Special Requests: ADEQUATE

## 2017-03-20 DEATH — deceased

## 2019-02-10 IMAGING — CR DG CHEST 1V PORT
1 series · 1 of 1 positions shown · non-contrast
Comparison: 02/09/2017

CLINICAL DATA: Acute respiratory failure.

EXAM:
PORTABLE CHEST 1 VIEW

[AP]
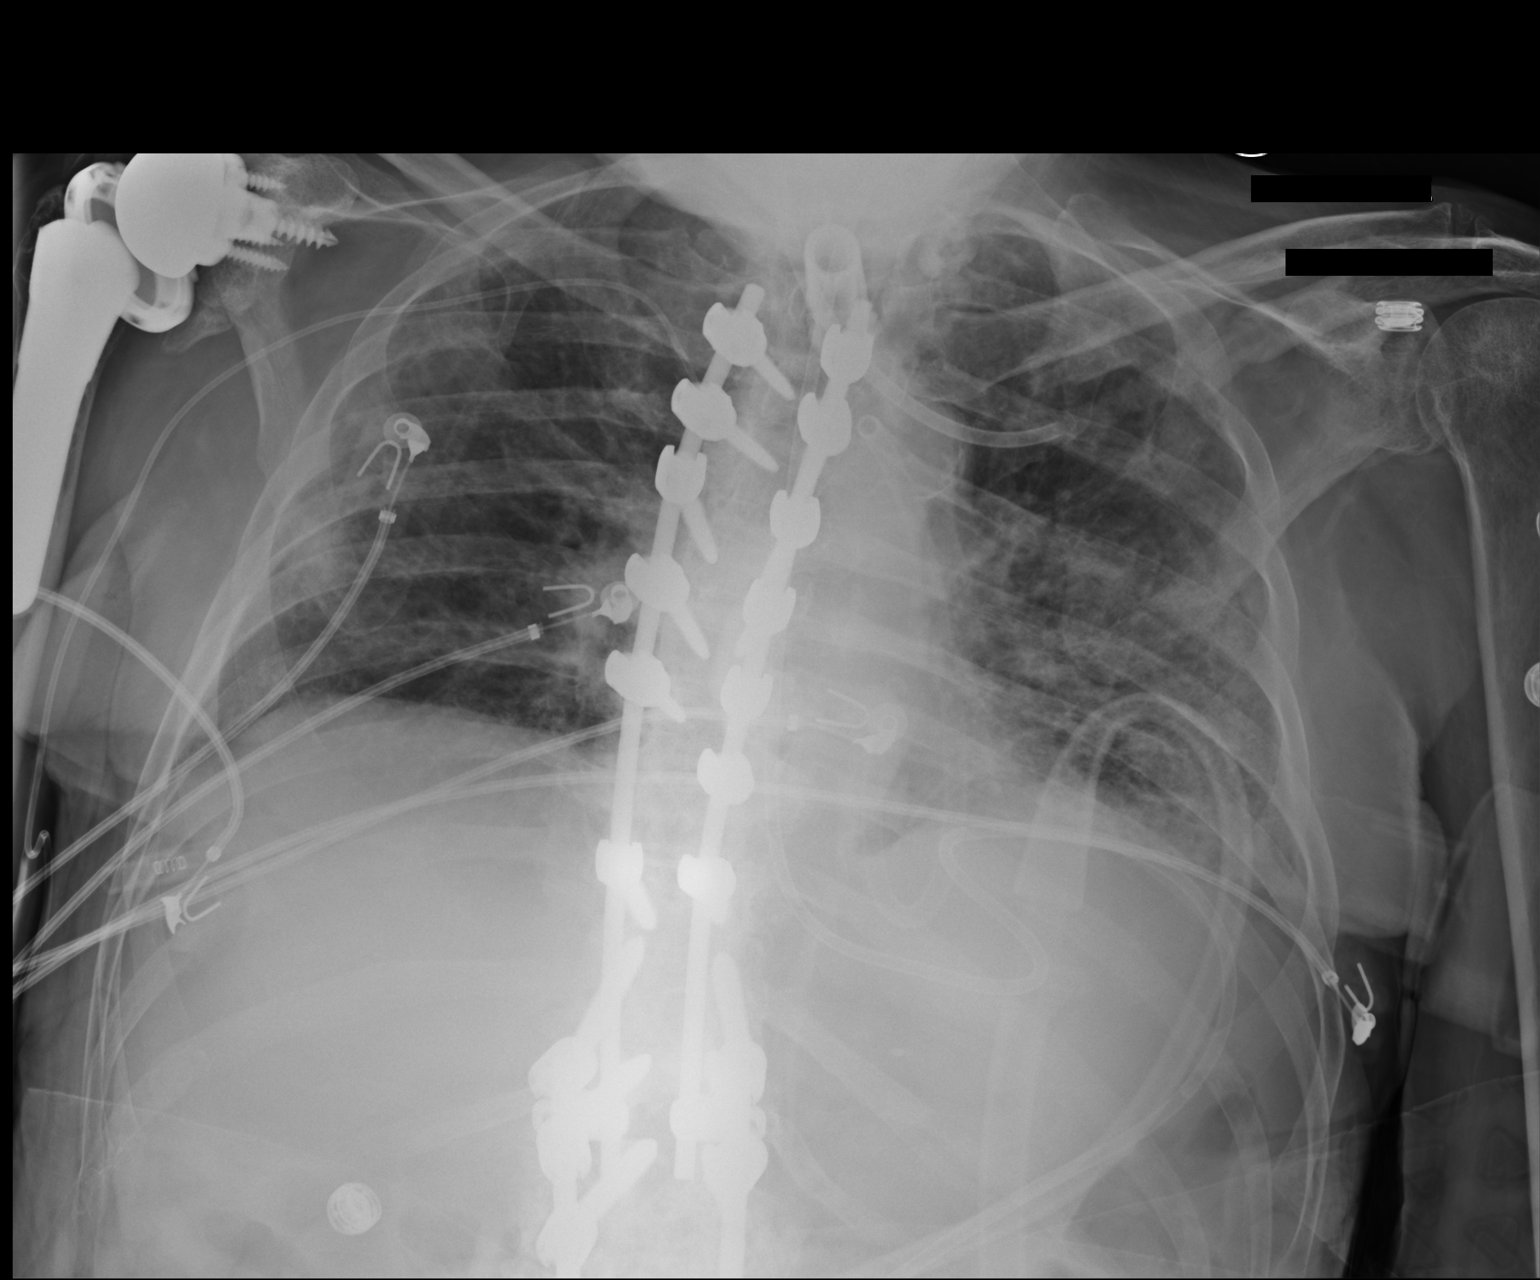

[1 of 1 positions shown; findings below may reference images not displayed]

FINDINGS: Low lung volumes. Right PICC line remains in place, unchanged.
Tracheostomy tube is unchanged. Patchy bilateral airspace opacities
are noted, left greater than right. Heart is normal size. There are
low lung volumes. No visible effusions.
IMPRESSION: Patchy bilateral airspace disease, left greater than right similar
to prior study and is concerning for

## 2019-02-15 IMAGING — CR DG CHEST 1V PORT
1 series · 1 of 1 positions shown · non-contrast
Comparison: 02/17/2017 and earlier.

CLINICAL DATA: 75-year-old male with fever. Prior perforated bowel
and abdominal surgery. Long-term ventilator dependent. Status post
left thoracentesis in [place].

EXAM:
PORTABLE CHEST 1 VIEW

[AP]
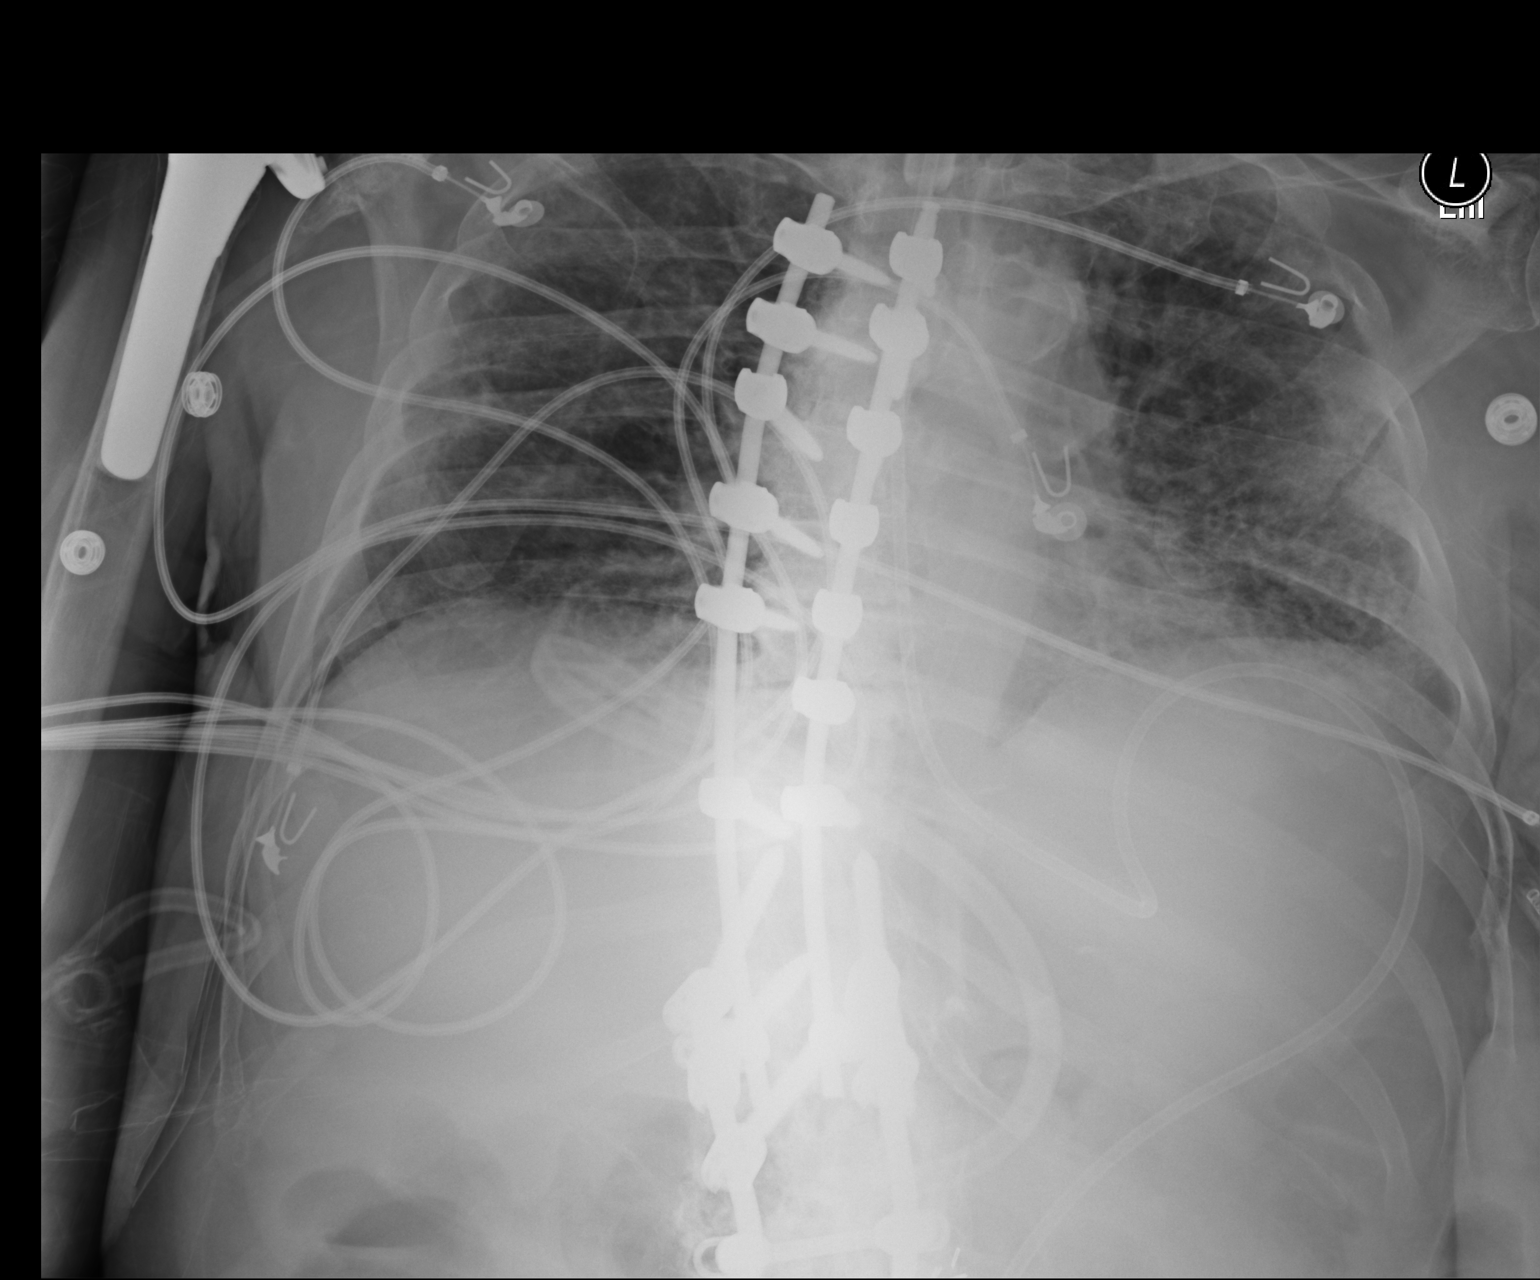

[1 of 1 positions shown; findings below may reference images not displayed]

FINDINGS: Portable AP semi upright view at 9053 hours. Right PICC line has
been removed.

Stable tracheostomy tube. Visible enteric tube is stable. Extensive
thoracolumbar spinal hardware and right shoulder arthroplasty again
noted. Mildly lower lung volumes. Mediastinal contours remain
normal. Calcified aortic atherosclerosis. Continued increased
reticulonodular opacity at the left lung base. No superimposed
pneumothorax, pulmonary edema, pleural effusion or consolidation.
IMPRESSION: 1. Right PICC line has been removed. Otherwise stable lines and
tubes.
2. Stable ventilation; low lung volumes and mild nonspecific
reticulonodular opacity at the left lung base.
